# Patient Record
Sex: Male | Born: 1990 | Race: Black or African American | Hispanic: No | Marital: Single
Health system: Southern US, Community
[De-identification: ages and names within clinical notes are randomized; demographics above are authoritative.]

---

## 2017-09-09 ENCOUNTER — Emergency Department (HOSPITAL_COMMUNITY): Payer: Self-pay

## 2017-09-09 ENCOUNTER — Encounter (HOSPITAL_COMMUNITY): Payer: Self-pay | Admitting: Emergency Medicine

## 2017-09-09 ENCOUNTER — Emergency Department (HOSPITAL_COMMUNITY)
Admission: EM | Admit: 2017-09-09 | Discharge: 2017-09-09 | Disposition: A | Discharge status: Home / Self Care | Payer: Self-pay | Attending: Emergency Medicine | Admitting: Emergency Medicine

## 2017-09-09 DIAGNOSIS — Y939 Activity, unspecified: Secondary | ICD-10-CM | POA: Insufficient documentation

## 2017-09-09 DIAGNOSIS — Y999 Unspecified external cause status: Secondary | ICD-10-CM | POA: Insufficient documentation

## 2017-09-09 DIAGNOSIS — Y929 Unspecified place or not applicable: Secondary | ICD-10-CM | POA: Insufficient documentation

## 2017-09-09 DIAGNOSIS — S0101XA Laceration without foreign body of scalp, initial encounter: Secondary | ICD-10-CM | POA: Insufficient documentation

## 2017-09-09 DIAGNOSIS — X58XXXA Exposure to other specified factors, initial encounter: Secondary | ICD-10-CM | POA: Insufficient documentation

## 2017-09-09 MED ORDER — LIDOCAINE-EPINEPHRINE (PF) 2 %-1:200000 IJ SOLN
20.0000 mL | Freq: Once | INTRAMUSCULAR | Status: AC
  Administered 2017-09-09: 20 mL via INTRADERMAL

## 2017-09-09 NOTE — ED Notes (Signed)
Declined W/C at D/C and was escorted to lobby by RN. 
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
## 2017-09-09 NOTE — Discharge Instructions (Signed)
The scan of your head was normal today.  You have 2 staples on the top of your head.  You will need to have the staples removed in 8 days.  Please schedule an appointment with your primary doctor or go to urgent care or ER to have your staples removed.  Return to the emergency department if you develop a worsening headache with vomiting that does not stop, headache with numbness or weakness or have any new or worsening symptoms.

## 2018-07-02 IMAGING — CT CT HEAD W/O CM
4 series · 15 of 47 positions shown, 17 images · non-contrast
Comparison: None.

CLINICAL DATA: Initial evaluation for acute trauma. Head
laceration.

EXAM:
CT HEAD WITHOUT CONTRAST
TECHNIQUE: Contiguous axial images were obtained from the base of the skull
through the vertex without intravenous contrast.

[Series 3: head without · axial · non-contrast · 0.45mm/px · z∈[-157,-37]mm · 7 of 32 slices shown, 9 images]
[im 4/32  brain]
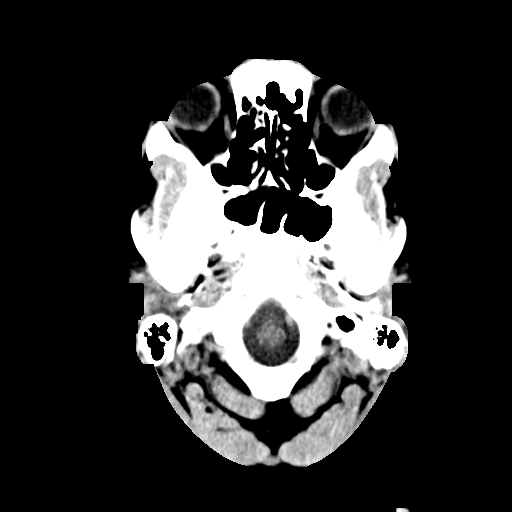
[im 4/32  bone]
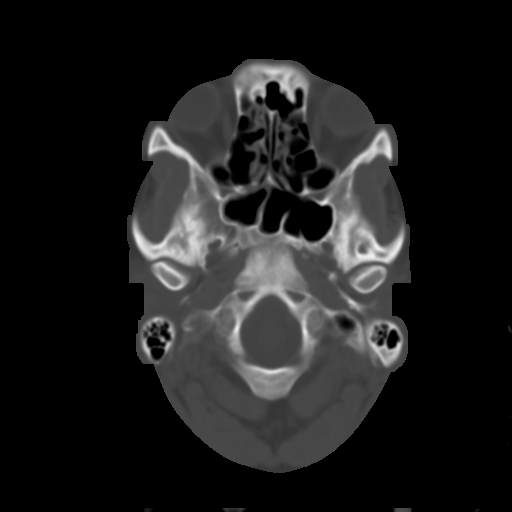
[im 8/32  brain]
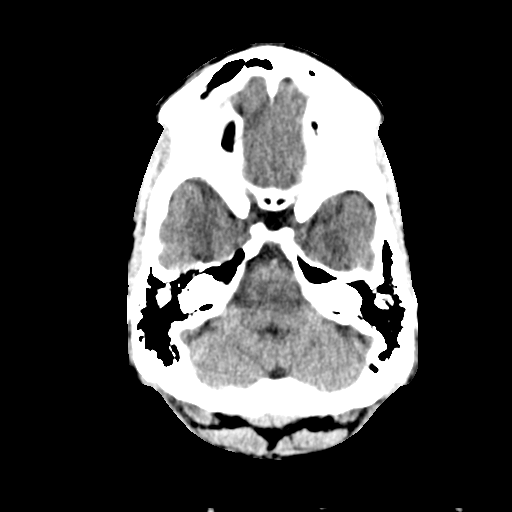
[im 12/32  brain]
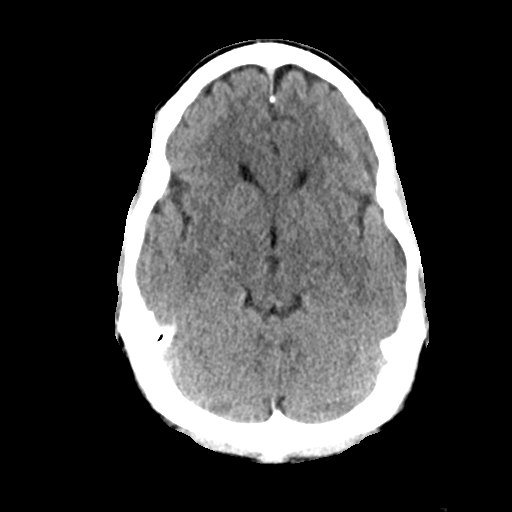
[im 16/32  brain]
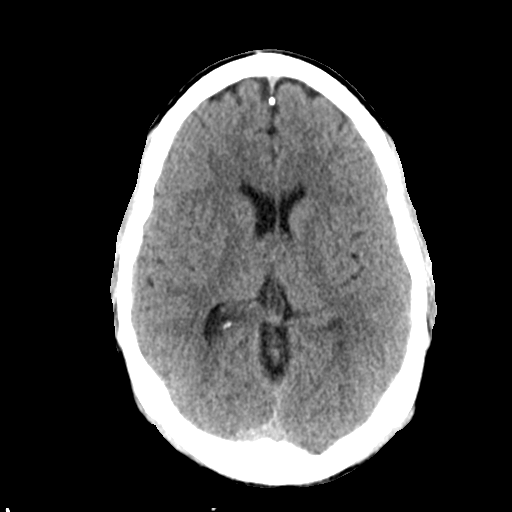
[im 20/32  brain]
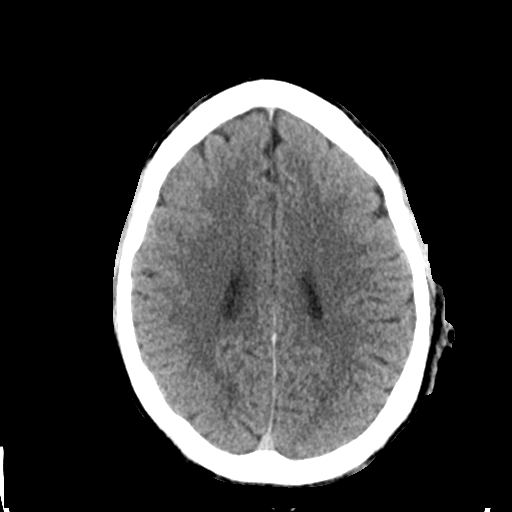
[im 20/32  bone]
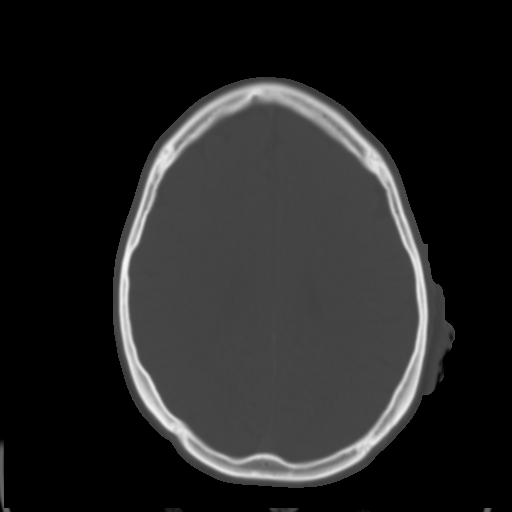
[im 24/32  brain]
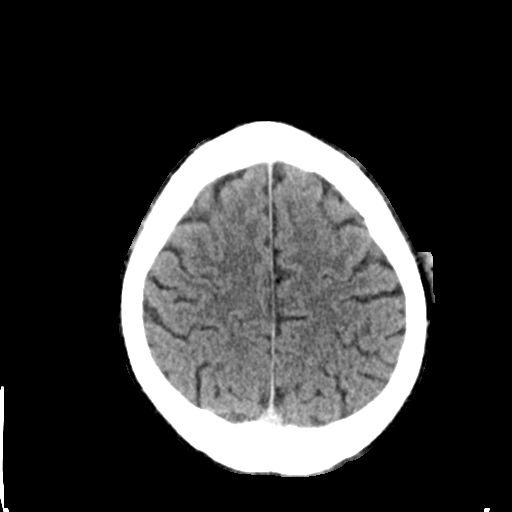
[im 28/32  brain]
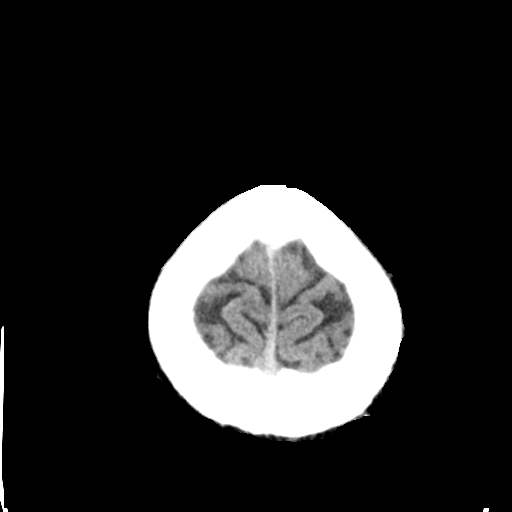

[Series 4: head bone · axial · 0.45mm/px · z∈[-158,-142]mm · 2 of 80 slices shown]
[im 8/80  bone]
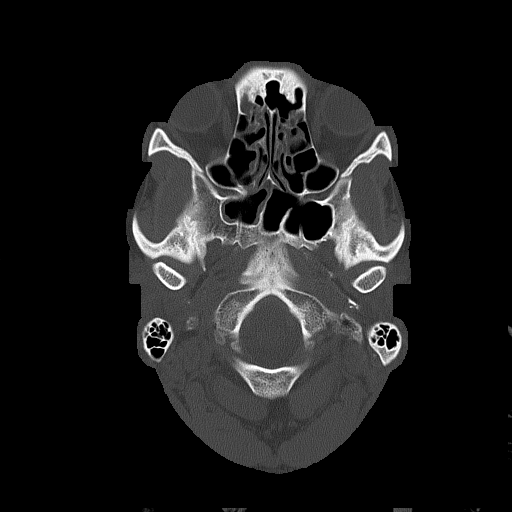
[im 16/80  bone]
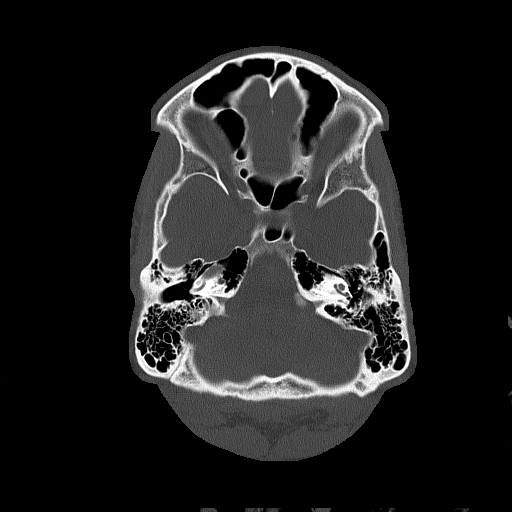

[Series 5: head without cor · coronal · non-contrast · 0.31mm/px · 3 of 73 slices shown]
[im 25/73  brain]
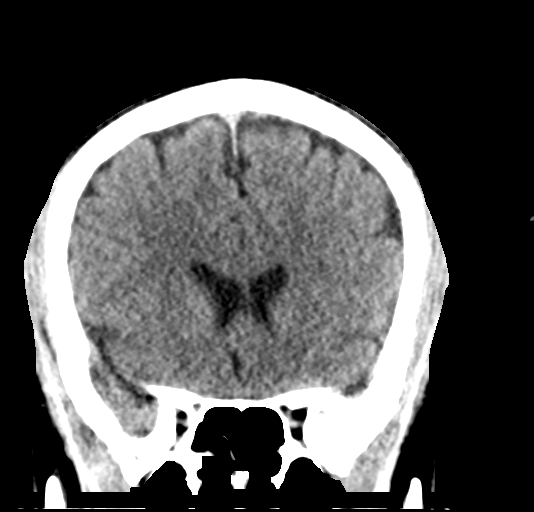
[im 33/73  brain]
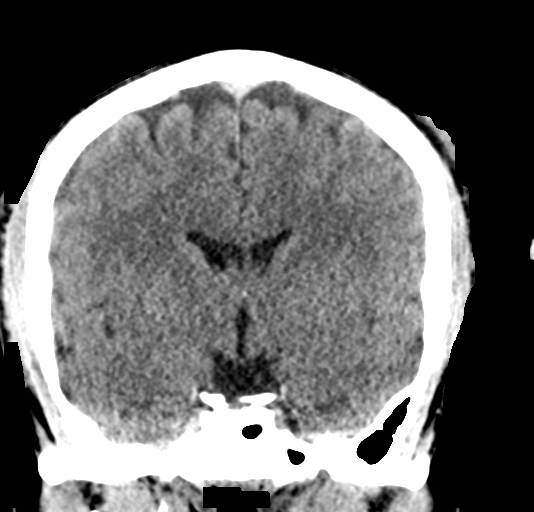
[im 41/73  brain]
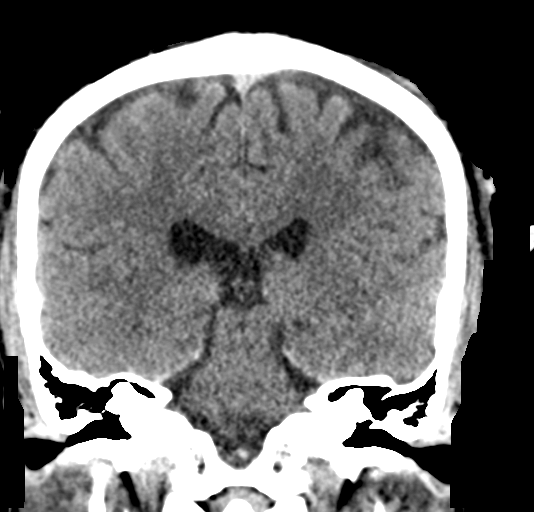

[Series 6: head without sag · sagittal · non-contrast · 0.31mm/px · 3 of 67 slices shown]
[im 23/67  brain]
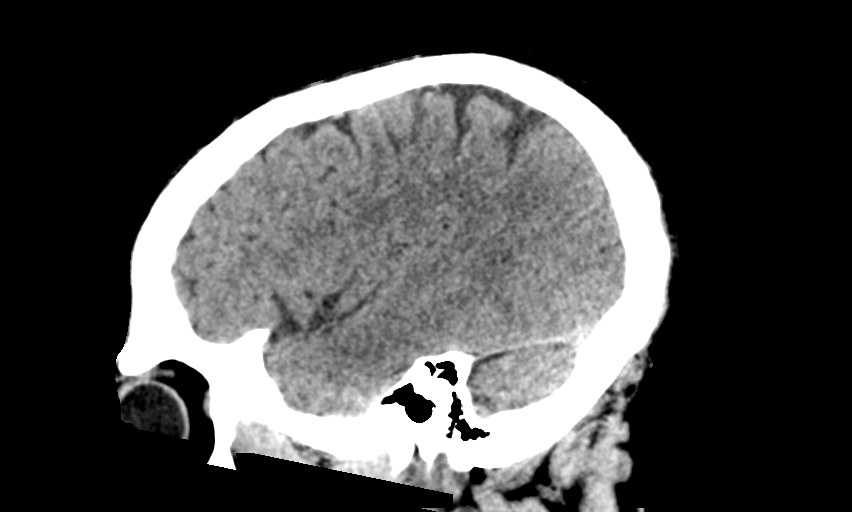
[im 34/67  brain]
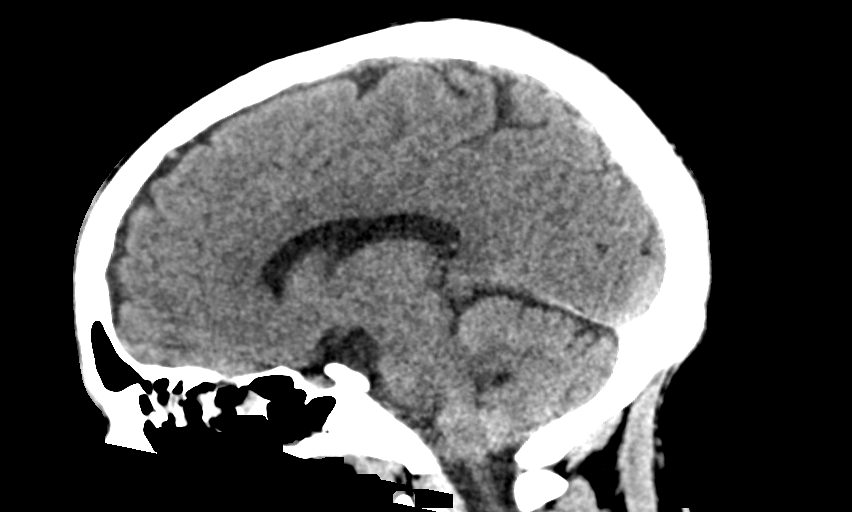
[im 45/67  brain]
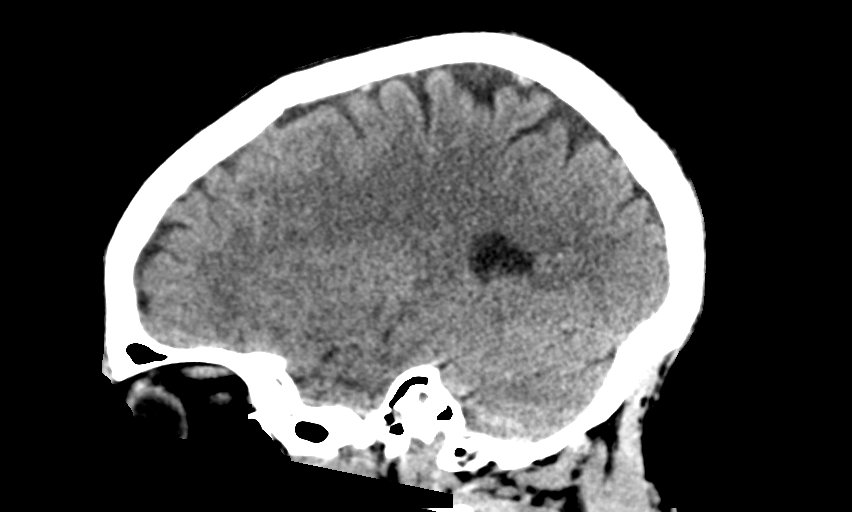

[15 of 47 positions shown; findings below may reference images not displayed]

FINDINGS: Brain: Cerebral volume within normal limits for patient age.

No evidence for acute intracranial hemorrhage. No findings to
suggest acute large vessel territory infarct. No mass lesion,
midline shift, or mass effect. Ventricles are normal in size without
evidence for hydrocephalus. No extra-axial fluid collection
identified.

Vascular: No hyperdense vessel identified.

Skull: Left parietal scalp laceration with skin staples in place. No
radiopaque foreign body. Calvarium intact.

Sinuses/Orbits: Globes and orbital soft tissues are within normal
limits.

Scattered mucosal thickening throughout the ethmoidal air cells,
right sphenoid sinus, and maxillary sinuses. No mastoid effusion.
IMPRESSION: 1. No acute intracranial process.
2. Left parietal scalp laceration with skin staples in place.
Calvarium intact.
